# Patient Record
Sex: Female | Born: 1990 | Race: White | Hispanic: No | Marital: Married | Smoking: Never smoker
Health system: Southern US, Community
[De-identification: ages and names within clinical notes are randomized; demographics above are authoritative.]

---

## 2020-12-06 ENCOUNTER — Emergency Department (INDEPENDENT_AMBULATORY_CARE_PROVIDER_SITE_OTHER): Payer: BLUE CROSS/BLUE SHIELD

## 2020-12-06 ENCOUNTER — Other Ambulatory Visit: Payer: Self-pay

## 2020-12-06 ENCOUNTER — Emergency Department (INDEPENDENT_AMBULATORY_CARE_PROVIDER_SITE_OTHER)
Admission: EM | Admit: 2020-12-06 | Discharge: 2020-12-06 | Disposition: A | Discharge status: Home / Self Care | Payer: BLUE CROSS/BLUE SHIELD | Source: Home / Self Care

## 2020-12-06 DIAGNOSIS — S8255XA Nondisplaced fracture of medial malleolus of left tibia, initial encounter for closed fracture: Secondary | ICD-10-CM

## 2020-12-06 DIAGNOSIS — S82392A Other fracture of lower end of left tibia, initial encounter for closed fracture: Secondary | ICD-10-CM

## 2020-12-06 DIAGNOSIS — S82432A Displaced oblique fracture of shaft of left fibula, initial encounter for closed fracture: Secondary | ICD-10-CM

## 2020-12-06 DIAGNOSIS — S82202A Unspecified fracture of shaft of left tibia, initial encounter for closed fracture: Secondary | ICD-10-CM

## 2020-12-06 DIAGNOSIS — W19XXXA Unspecified fall, initial encounter: Secondary | ICD-10-CM

## 2020-12-06 DIAGNOSIS — S82402A Unspecified fracture of shaft of left fibula, initial encounter for closed fracture: Secondary | ICD-10-CM | POA: Diagnosis not present

## 2020-12-06 DIAGNOSIS — W000XXA Fall on same level due to ice and snow, initial encounter: Secondary | ICD-10-CM | POA: Diagnosis not present

## 2020-12-06 MED ORDER — TRAMADOL HCL 50 MG PO TABS: 100.0000 mg | ORAL_TABLET | Freq: Four times a day (QID) | ORAL | 0 refills | Status: AC | PRN

## 2020-12-06 NOTE — Discharge Instructions (Addendum)
Follow-up with orthopedic specialty when you return to [facility] within the next 24 to 48 hours call to schedule follow-up appointment as you has sustained 3 fractures involving your tibia, fibula, and the posterior malleolus of your ankle.  I

## 2020-12-06 NOTE — ED Provider Notes (Signed)
[facility]    CSN: [id number] Arrival date & time: 12/06/20  0850      History   Chief Complaint Chief Complaint  Patient presents with  . Ankle Pain    Left    HPI Rita Parker is a 30 y.o. female.   HPI Patient presents today with an ankle injury to the left ankle after slipping on ice and twisting ankle and inverting foot nearly backwards.  She has range of motion in her distal toes complains of pain and is unable to bear weight. 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 She has not taken any medication as injury occurred prior to arrival here for evaluation.  History reviewed. No pertinent past medical history.  There are no problems to display for this patient.   History reviewed. No pertinent surgical history.  OB History   No obstetric history on file.      Home Medications    Prior to Admission medications   Medication Sig Start Date End Date Taking? Authorizing Provider  thyroid (ARMOUR) 32.5 MG tablet Take 32.5 mg by mouth daily.   Yes [provider]    Family History Family History  Problem Relation Age of Onset  . Healthy Mother   . Healthy Father     Social History Social History   Tobacco Use  . Smoking status: Never Smoker  . Smokeless tobacco: Never Used  Vaping Use  . Vaping Use: Never used  Substance Use Topics  . Alcohol use: Yes    Alcohol/week: 3.0 standard drinks    Types: 3 Standard drinks or equivalent per week    Comment: weekly     Allergies   Patient has no known allergies.  Review of Systems Review of Systems Pertinent negatives listed in HPI  Physical Exam Triage Vital Signs ED Triage Vitals  Enc Vitals Group     BP 12/06/20 0909 140/88     Pulse Rate 12/06/20 0909 81     Resp 12/06/20 0909 16     Temp 12/06/20 0909 98.1 F (36.7 C)     Temp Source 12/06/20 0909 Oral     SpO2 12/06/20 0909 100 %     Weight --      Height --      Head  Circumference --      Peak Flow --      Pain Score 12/06/20 0906 7     Pain Loc --      Pain Edu? --      Excl. in GC? --    No data found.  Updated Vital Signs BP 140/88 (BP Location: Right Arm)   Pulse 81   Temp 98.1 F (36.7 C) (Oral)   Resp 16   SpO2 100%   Visual Acuity Right Eye Distance:   Left Eye Distance:   Bilateral Distance:    Right Eye Near:   Left Eye Near:    Bilateral Near:     Physical Exam Constitutional:      Appearance: Normal appearance.  Cardiovascular:     Rate and Rhythm: Normal rate.  Pulmonary:     Effort: Pulmonary effort is normal.     Breath sounds: Normal breath sounds and air entry.  Musculoskeletal:     Left ankle: Swelling and ecchymosis present. Tenderness present over the lateral malleolus, medial malleolus and proximal fibula. No base of 5th metatarsal tenderness. Decreased range of motion.     Left  Achilles Tendon: Normal.  Psychiatric:        Behavior: Behavior is cooperative.      UC Treatments / Results  Labs (all labs ordered are listed, but only abnormal results are displayed) Labs Reviewed - No data to display  EKG   Radiology No results found.  Procedures Procedures (including critical care time)  Medications Ordered in UC Medications - No data to display  Initial Impression / Assessment and Plan / UC Course  I have reviewed the triage vital signs and the nursing notes.  Pertinent labs & imaging results that were available during my care of the patient were reviewed by me and considered in my medical decision making (see chart for details).    Imaging significant for left tibia and left fibula fracture with nondisplaced left ankle fracture posterior malleolus. Cam walker applied to left extremity and instructions to ambulate with crutches and remain non-weigh bearing until follow-up with orthopedics. Tramadol for pain. Ok to return home today via airlines given short duration of flight approximately 1.5  hrs. Patient given [facility] to call to schedule an appointment. Patient verbalized understanding. Final Clinical Impressions(s) / UC Diagnoses   Final diagnoses:  Closed fracture of left tibia and fibula, initial encounter, displaced  Closed traumatic nondisplaced fracture of posterior malleolus of left tibia, initial encounter     Discharge Instructions     Follow-up with orthopedic specialty when you return to [facility] within the next 24 to 48 hours call to schedule follow-up appointment as you has sustained 3 fractures involving your tibia, fibula, and the posterior malleolus of your ankle.  I     ED Prescriptions    Medication Sig Dispense Auth. Provider   traMADol (ULTRAM) 50 MG tablet Take 2 tablets (100 mg total) by mouth every 6 (six) hours as needed for up to 5 days. 30 tablet Bing Neighbors, FNP     PDMP not reviewed this encounter.   Bing Neighbors, [place] 12/08/20 [phone]

## 2020-12-06 NOTE — ED Triage Notes (Signed)
Patient presents to Urgent Care with complaints of left ankle pain and swelling since slipping on ice this morning, twisting her ankle and foot backwards as she fell. Patient reports she is able to move her toes, cannot bear weight.

## 2021-11-25 IMAGING — DX DG TIBIA/FIBULA 2V*L*
2 series · 2 of 2 positions shown · non-contrast
Comparison: None

CLINICAL DATA: LEFT lateral ankle pain after slipping on ice

EXAM:
LEFT TIBIA AND FIBULA - 2 VIEW

[tibia ap]
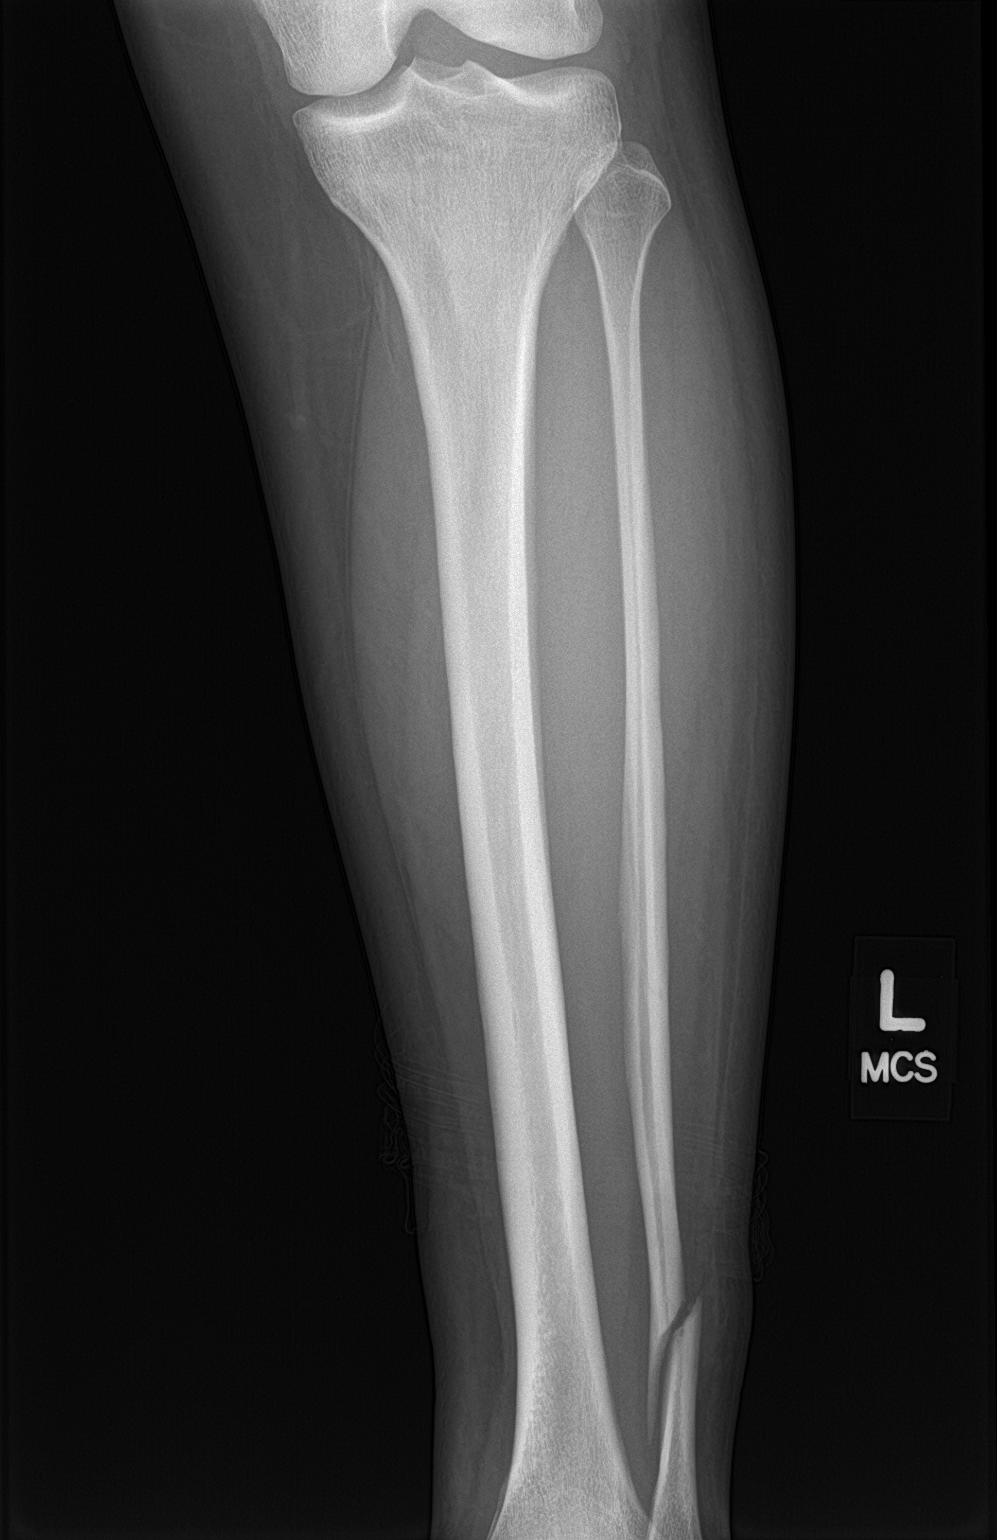

[tibia lat]
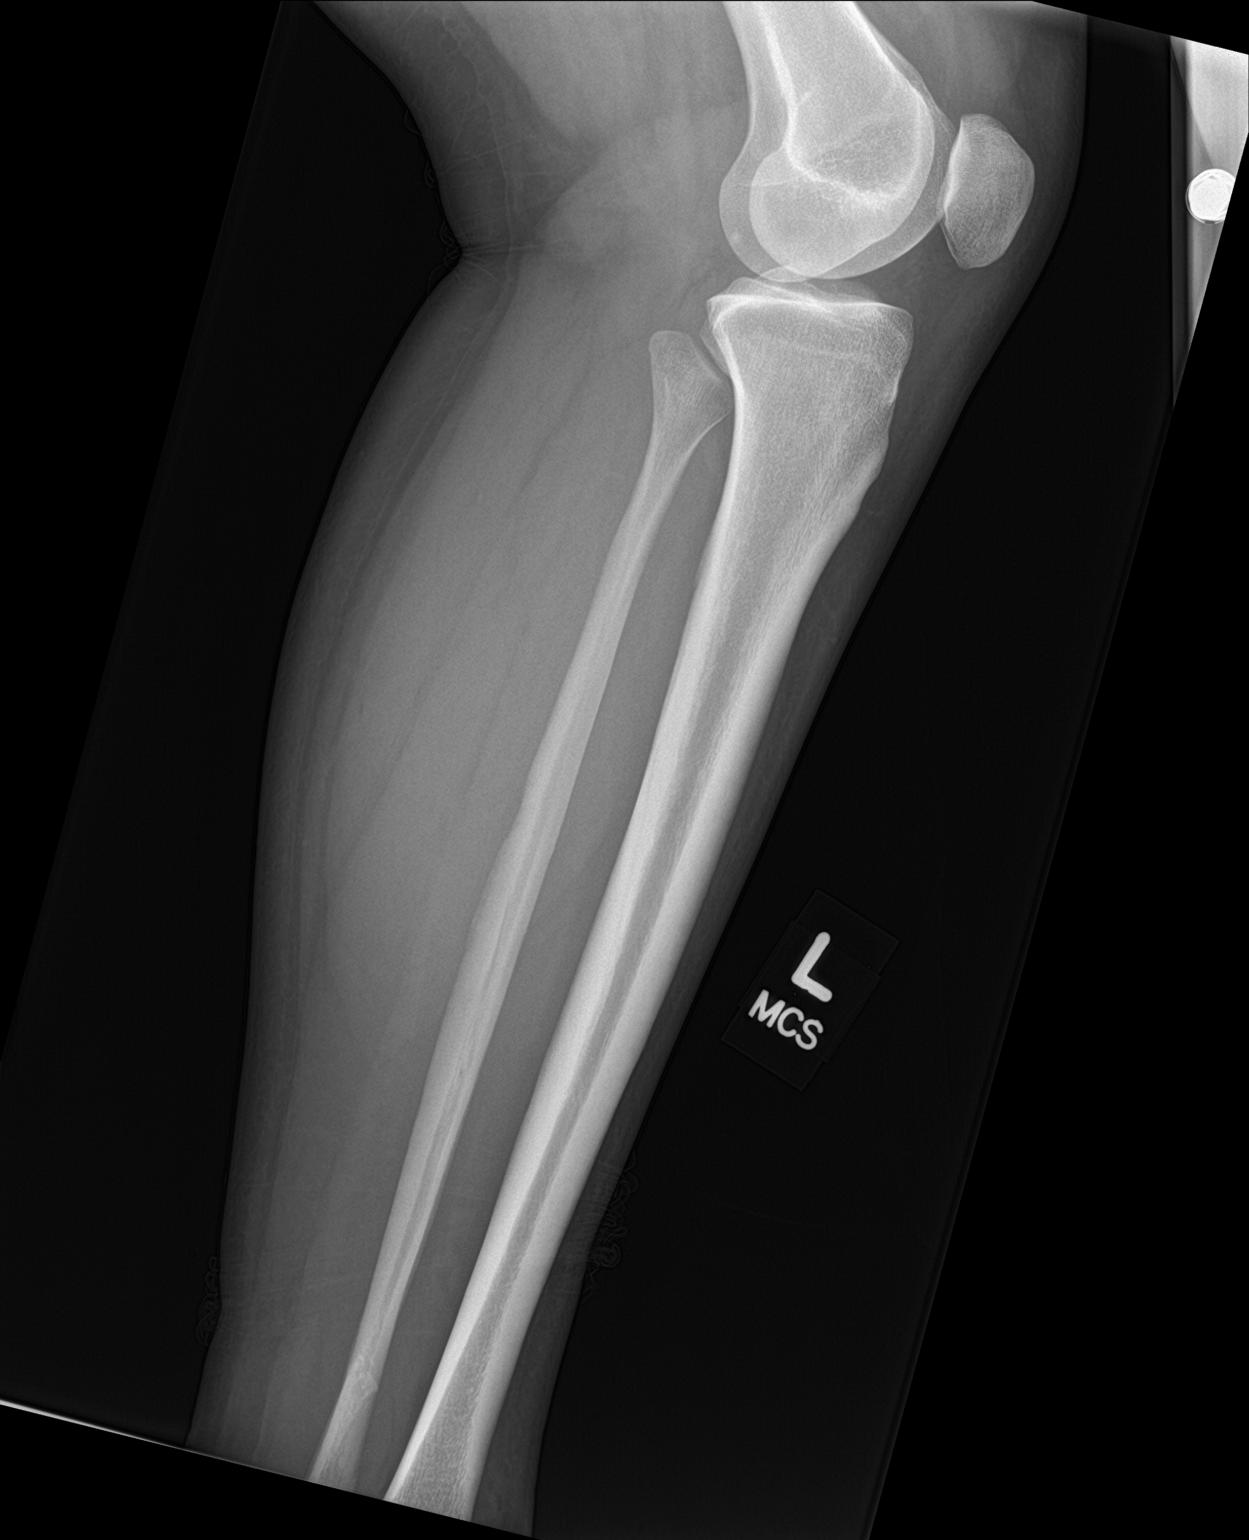

[2 of 2 positions shown; findings below may reference images not displayed]

FINDINGS: Osseous mineralization normal.

Knee and ankle joint alignments normal.

Oblique fracture of distal LEFT fibular diaphysis, minimally
displaced laterally.

Distal tibial fracture seen on accompanying LEFT ankle radiographs
is not demonstrated by this exam, which excludes the ankle.

No additional fracture or dislocation seen.
IMPRESSION: Displaced oblique fracture of the distal LEFT fibular diaphysis.
